# Patient Record
Sex: Female | Born: 1950
Health system: Southern US, Community
[De-identification: ages and names within clinical notes are randomized; demographics above are authoritative.]

## PROBLEM LIST (undated history)

## (undated) DIAGNOSIS — I10 Essential (primary) hypertension: Secondary | ICD-10-CM

## (undated) HISTORY — PX: BREAST CYST ASPIRATION: SHX578

---

## 2004-10-12 ENCOUNTER — Inpatient Hospital Stay: Payer: Self-pay | Admitting: Internal Medicine

## 2004-10-12 ENCOUNTER — Other Ambulatory Visit: Payer: Self-pay

## 2006-02-24 ENCOUNTER — Ambulatory Visit: Payer: Self-pay | Admitting: Urology

## 2006-04-21 ENCOUNTER — Ambulatory Visit: Payer: Self-pay | Admitting: Internal Medicine

## 2007-05-21 ENCOUNTER — Ambulatory Visit: Payer: Self-pay | Admitting: Internal Medicine

## 2015-11-02 ENCOUNTER — Other Ambulatory Visit: Payer: Self-pay | Admitting: Internal Medicine

## 2015-11-07 ENCOUNTER — Other Ambulatory Visit: Payer: Self-pay | Admitting: Internal Medicine

## 2015-11-07 ENCOUNTER — Other Ambulatory Visit: Payer: Self-pay | Admitting: *Deleted

## 2015-11-07 ENCOUNTER — Inpatient Hospital Stay
Admission: RE | Admit: 2015-11-07 | Discharge: 2015-11-07 | Disposition: A | Discharge status: Home / Self Care | Payer: Self-pay | Source: Ambulatory Visit | Attending: *Deleted | Admitting: *Deleted

## 2015-11-07 DIAGNOSIS — Z1231 Encounter for screening mammogram for malignant neoplasm of breast: Secondary | ICD-10-CM

## 2015-11-07 DIAGNOSIS — Z9289 Personal history of other medical treatment: Secondary | ICD-10-CM

## 2015-11-20 ENCOUNTER — Other Ambulatory Visit: Payer: Self-pay | Admitting: Internal Medicine

## 2015-11-20 ENCOUNTER — Ambulatory Visit
Admission: RE | Admit: 2015-11-20 | Discharge: 2015-11-20 | Disposition: A | Discharge status: Home / Self Care | Payer: BLUE CROSS/BLUE SHIELD | Source: Ambulatory Visit | Attending: Internal Medicine | Admitting: Internal Medicine

## 2015-11-20 DIAGNOSIS — Z1231 Encounter for screening mammogram for malignant neoplasm of breast: Secondary | ICD-10-CM | POA: Diagnosis present

## 2016-03-13 ENCOUNTER — Emergency Department: Payer: No Typology Code available for payment source

## 2016-03-13 ENCOUNTER — Encounter: Payer: Self-pay | Admitting: Emergency Medicine

## 2016-03-13 ENCOUNTER — Emergency Department
Admission: EM | Admit: 2016-03-13 | Discharge: 2016-03-13 | Disposition: A | Discharge status: Home / Self Care | Payer: No Typology Code available for payment source | Attending: Emergency Medicine | Admitting: Emergency Medicine

## 2016-03-13 DIAGNOSIS — I10 Essential (primary) hypertension: Secondary | ICD-10-CM | POA: Insufficient documentation

## 2016-03-13 DIAGNOSIS — Y9389 Activity, other specified: Secondary | ICD-10-CM | POA: Diagnosis not present

## 2016-03-13 DIAGNOSIS — Y92411 Interstate highway as the place of occurrence of the external cause: Secondary | ICD-10-CM | POA: Insufficient documentation

## 2016-03-13 DIAGNOSIS — Y999 Unspecified external cause status: Secondary | ICD-10-CM | POA: Insufficient documentation

## 2016-03-13 DIAGNOSIS — S29019A Strain of muscle and tendon of unspecified wall of thorax, initial encounter: Secondary | ICD-10-CM

## 2016-03-13 DIAGNOSIS — S299XXA Unspecified injury of thorax, initial encounter: Secondary | ICD-10-CM | POA: Diagnosis present

## 2016-03-13 DIAGNOSIS — S29012A Strain of muscle and tendon of back wall of thorax, initial encounter: Secondary | ICD-10-CM | POA: Diagnosis not present

## 2016-03-13 HISTORY — DX: Essential (primary) hypertension: I10

## 2016-03-13 MED ORDER — NAPROXEN 500 MG PO TABS: 500.0000 mg | ORAL_TABLET | Freq: Two times a day (BID) | ORAL | 0 refills | Status: AC

## 2016-03-13 MED ORDER — CYCLOBENZAPRINE HCL 10 MG PO TABS: 10.0000 mg | ORAL_TABLET | Freq: Three times a day (TID) | ORAL | 0 refills | Status: AC | PRN
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
## 2016-03-13 NOTE — Discharge Instructions (Signed)
Follow up with your PCP for symptoms that are not improving over the week. Expect to be more sore tomorrow and in different places than you are today. Return to the ER for symptoms that change or worsen if you are unable to schedule an appointment.

## 2016-03-21 DIAGNOSIS — R739 Hyperglycemia, unspecified: Secondary | ICD-10-CM | POA: Insufficient documentation

## 2016-09-09 ENCOUNTER — Other Ambulatory Visit: Payer: Self-pay | Admitting: Internal Medicine

## 2016-09-09 DIAGNOSIS — Z1231 Encounter for screening mammogram for malignant neoplasm of breast: Secondary | ICD-10-CM

## 2016-11-20 ENCOUNTER — Ambulatory Visit
Admission: RE | Admit: 2016-11-20 | Discharge: 2016-11-20 | Disposition: A | Discharge status: Home / Self Care | Payer: BLUE CROSS/BLUE SHIELD | Source: Ambulatory Visit | Attending: Internal Medicine | Admitting: Internal Medicine

## 2016-11-20 DIAGNOSIS — Z1231 Encounter for screening mammogram for malignant neoplasm of breast: Secondary | ICD-10-CM | POA: Insufficient documentation

## 2018-01-13 ENCOUNTER — Other Ambulatory Visit: Payer: Self-pay | Admitting: Internal Medicine

## 2018-01-13 DIAGNOSIS — Z1231 Encounter for screening mammogram for malignant neoplasm of breast: Secondary | ICD-10-CM

## 2018-03-16 ENCOUNTER — Ambulatory Visit
Admission: RE | Admit: 2018-03-16 | Discharge: 2018-03-16 | Disposition: A | Discharge status: Home / Self Care | Payer: Medicare HMO | Source: Ambulatory Visit | Attending: Internal Medicine | Admitting: Internal Medicine

## 2018-03-16 DIAGNOSIS — Z1231 Encounter for screening mammogram for malignant neoplasm of breast: Secondary | ICD-10-CM | POA: Diagnosis not present

## 2018-03-17 ENCOUNTER — Other Ambulatory Visit: Payer: Self-pay | Admitting: Internal Medicine

## 2018-03-17 DIAGNOSIS — N6320 Unspecified lump in the left breast, unspecified quadrant: Secondary | ICD-10-CM

## 2018-03-17 DIAGNOSIS — R928 Other abnormal and inconclusive findings on diagnostic imaging of breast: Secondary | ICD-10-CM

## 2018-04-02 ENCOUNTER — Ambulatory Visit
Admission: RE | Admit: 2018-04-02 | Discharge: 2018-04-02 | Disposition: A | Discharge status: Home / Self Care | Payer: Medicare HMO | Source: Ambulatory Visit | Attending: Internal Medicine | Admitting: Internal Medicine

## 2018-04-02 DIAGNOSIS — N6320 Unspecified lump in the left breast, unspecified quadrant: Secondary | ICD-10-CM

## 2018-04-02 DIAGNOSIS — R928 Other abnormal and inconclusive findings on diagnostic imaging of breast: Secondary | ICD-10-CM | POA: Diagnosis present

## 2019-03-16 ENCOUNTER — Other Ambulatory Visit: Payer: Self-pay | Admitting: Internal Medicine

## 2019-03-16 DIAGNOSIS — Z1231 Encounter for screening mammogram for malignant neoplasm of breast: Secondary | ICD-10-CM

## 2019-04-20 ENCOUNTER — Ambulatory Visit
Admission: RE | Admit: 2019-04-20 | Discharge: 2019-04-20 | Disposition: A | Discharge status: Home / Self Care | Payer: Medicare HMO | Source: Ambulatory Visit | Attending: Internal Medicine | Admitting: Internal Medicine

## 2019-04-20 DIAGNOSIS — Z1231 Encounter for screening mammogram for malignant neoplasm of breast: Secondary | ICD-10-CM | POA: Insufficient documentation

## 2019-12-19 DIAGNOSIS — M25511 Pain in right shoulder: Secondary | ICD-10-CM | POA: Insufficient documentation

## 2019-12-19 DIAGNOSIS — K5790 Diverticulosis of intestine, part unspecified, without perforation or abscess without bleeding: Secondary | ICD-10-CM | POA: Insufficient documentation

## 2019-12-19 DIAGNOSIS — R1031 Right lower quadrant pain: Secondary | ICD-10-CM | POA: Insufficient documentation

## 2019-12-19 DIAGNOSIS — I10 Essential (primary) hypertension: Secondary | ICD-10-CM | POA: Insufficient documentation

## 2019-12-19 DIAGNOSIS — E559 Vitamin D deficiency, unspecified: Secondary | ICD-10-CM | POA: Insufficient documentation

## 2019-12-19 DIAGNOSIS — Z87448 Personal history of other diseases of urinary system: Secondary | ICD-10-CM | POA: Insufficient documentation

## 2019-12-19 DIAGNOSIS — J302 Other seasonal allergic rhinitis: Secondary | ICD-10-CM | POA: Insufficient documentation

## 2019-12-19 DIAGNOSIS — H43819 Vitreous degeneration, unspecified eye: Secondary | ICD-10-CM | POA: Insufficient documentation

## 2020-03-26 ENCOUNTER — Other Ambulatory Visit: Payer: Self-pay | Admitting: Internal Medicine

## 2020-03-26 DIAGNOSIS — Z1231 Encounter for screening mammogram for malignant neoplasm of breast: Secondary | ICD-10-CM

## 2020-04-25 ENCOUNTER — Ambulatory Visit
Admission: RE | Admit: 2020-04-25 | Discharge: 2020-04-25 | Disposition: A | Discharge status: Home / Self Care | Payer: Medicare HMO | Source: Ambulatory Visit | Attending: Internal Medicine | Admitting: Internal Medicine

## 2020-04-25 ENCOUNTER — Other Ambulatory Visit: Payer: Self-pay

## 2020-04-25 DIAGNOSIS — Z1231 Encounter for screening mammogram for malignant neoplasm of breast: Secondary | ICD-10-CM | POA: Insufficient documentation

## 2020-05-01 ENCOUNTER — Other Ambulatory Visit: Payer: Self-pay | Admitting: Internal Medicine

## 2020-05-01 DIAGNOSIS — N6489 Other specified disorders of breast: Secondary | ICD-10-CM

## 2020-05-01 DIAGNOSIS — R928 Other abnormal and inconclusive findings on diagnostic imaging of breast: Secondary | ICD-10-CM

## 2020-05-02 DIAGNOSIS — R928 Other abnormal and inconclusive findings on diagnostic imaging of breast: Secondary | ICD-10-CM | POA: Diagnosis not present

## 2020-05-02 DIAGNOSIS — N644 Mastodynia: Secondary | ICD-10-CM | POA: Diagnosis not present

## 2020-05-04 ENCOUNTER — Ambulatory Visit
Admission: RE | Admit: 2020-05-04 | Discharge: 2020-05-04 | Disposition: A | Discharge status: Home / Self Care | Payer: Medicare HMO | Source: Ambulatory Visit | Attending: Internal Medicine | Admitting: Internal Medicine

## 2020-05-04 ENCOUNTER — Other Ambulatory Visit: Payer: Self-pay

## 2020-05-04 ENCOUNTER — Other Ambulatory Visit: Payer: Self-pay | Admitting: Internal Medicine

## 2020-05-04 DIAGNOSIS — R922 Inconclusive mammogram: Secondary | ICD-10-CM | POA: Diagnosis not present

## 2020-05-04 DIAGNOSIS — N6489 Other specified disorders of breast: Secondary | ICD-10-CM | POA: Diagnosis not present

## 2020-05-04 DIAGNOSIS — R928 Other abnormal and inconclusive findings on diagnostic imaging of breast: Secondary | ICD-10-CM | POA: Insufficient documentation

## 2020-05-04 DIAGNOSIS — N644 Mastodynia: Secondary | ICD-10-CM | POA: Diagnosis not present

## 2020-09-27 DIAGNOSIS — H43813 Vitreous degeneration, bilateral: Secondary | ICD-10-CM | POA: Diagnosis not present

## 2020-09-27 DIAGNOSIS — H26493 Other secondary cataract, bilateral: Secondary | ICD-10-CM | POA: Diagnosis not present

## 2020-11-08 DIAGNOSIS — R1011 Right upper quadrant pain: Secondary | ICD-10-CM | POA: Diagnosis not present

## 2020-11-08 DIAGNOSIS — R143 Flatulence: Secondary | ICD-10-CM | POA: Diagnosis not present

## 2020-11-08 DIAGNOSIS — R140 Abdominal distension (gaseous): Secondary | ICD-10-CM | POA: Diagnosis not present

## 2020-12-14 DIAGNOSIS — H26493 Other secondary cataract, bilateral: Secondary | ICD-10-CM | POA: Diagnosis not present

## 2020-12-14 DIAGNOSIS — H26492 Other secondary cataract, left eye: Secondary | ICD-10-CM | POA: Diagnosis not present

## 2020-12-14 DIAGNOSIS — H40013 Open angle with borderline findings, low risk, bilateral: Secondary | ICD-10-CM | POA: Diagnosis not present

## 2020-12-14 DIAGNOSIS — Z961 Presence of intraocular lens: Secondary | ICD-10-CM | POA: Diagnosis not present

## 2021-01-10 DIAGNOSIS — R1011 Right upper quadrant pain: Secondary | ICD-10-CM | POA: Diagnosis not present

## 2021-02-18 DIAGNOSIS — H26491 Other secondary cataract, right eye: Secondary | ICD-10-CM | POA: Diagnosis not present

## 2021-03-27 ENCOUNTER — Other Ambulatory Visit: Payer: Self-pay | Admitting: Internal Medicine

## 2021-03-27 DIAGNOSIS — Z1231 Encounter for screening mammogram for malignant neoplasm of breast: Secondary | ICD-10-CM

## 2021-03-29 DIAGNOSIS — Z87448 Personal history of other diseases of urinary system: Secondary | ICD-10-CM | POA: Diagnosis not present

## 2021-03-29 DIAGNOSIS — E785 Hyperlipidemia, unspecified: Secondary | ICD-10-CM | POA: Diagnosis not present

## 2021-03-29 DIAGNOSIS — E559 Vitamin D deficiency, unspecified: Secondary | ICD-10-CM | POA: Diagnosis not present

## 2021-03-29 DIAGNOSIS — R739 Hyperglycemia, unspecified: Secondary | ICD-10-CM | POA: Diagnosis not present

## 2021-04-03 DIAGNOSIS — Z1389 Encounter for screening for other disorder: Secondary | ICD-10-CM | POA: Diagnosis not present

## 2021-04-03 DIAGNOSIS — Z0000 Encounter for general adult medical examination without abnormal findings: Secondary | ICD-10-CM | POA: Diagnosis not present

## 2021-04-03 DIAGNOSIS — I10 Essential (primary) hypertension: Secondary | ICD-10-CM | POA: Diagnosis not present

## 2021-04-03 DIAGNOSIS — Z87448 Personal history of other diseases of urinary system: Secondary | ICD-10-CM | POA: Diagnosis not present

## 2021-04-03 DIAGNOSIS — E559 Vitamin D deficiency, unspecified: Secondary | ICD-10-CM | POA: Diagnosis not present

## 2021-04-03 DIAGNOSIS — Z780 Asymptomatic menopausal state: Secondary | ICD-10-CM | POA: Diagnosis not present

## 2021-04-03 DIAGNOSIS — R739 Hyperglycemia, unspecified: Secondary | ICD-10-CM | POA: Diagnosis not present

## 2021-04-03 DIAGNOSIS — E785 Hyperlipidemia, unspecified: Secondary | ICD-10-CM | POA: Diagnosis not present

## 2021-05-20 DIAGNOSIS — H43813 Vitreous degeneration, bilateral: Secondary | ICD-10-CM | POA: Diagnosis not present

## 2021-05-20 DIAGNOSIS — H04123 Dry eye syndrome of bilateral lacrimal glands: Secondary | ICD-10-CM | POA: Diagnosis not present

## 2021-05-20 DIAGNOSIS — H26493 Other secondary cataract, bilateral: Secondary | ICD-10-CM | POA: Diagnosis not present

## 2021-06-03 DIAGNOSIS — M810 Age-related osteoporosis without current pathological fracture: Secondary | ICD-10-CM | POA: Diagnosis not present

## 2021-06-04 ENCOUNTER — Other Ambulatory Visit: Payer: Self-pay

## 2021-06-04 ENCOUNTER — Ambulatory Visit
Admission: RE | Admit: 2021-06-04 | Discharge: 2021-06-04 | Disposition: A | Discharge status: Home / Self Care | Payer: Medicare HMO | Source: Ambulatory Visit | Attending: Internal Medicine | Admitting: Internal Medicine

## 2021-06-04 DIAGNOSIS — Z1231 Encounter for screening mammogram for malignant neoplasm of breast: Secondary | ICD-10-CM | POA: Diagnosis not present

## 2021-10-25 DIAGNOSIS — M79675 Pain in left toe(s): Secondary | ICD-10-CM | POA: Diagnosis not present

## 2021-10-25 DIAGNOSIS — M79674 Pain in right toe(s): Secondary | ICD-10-CM | POA: Diagnosis not present

## 2021-10-25 DIAGNOSIS — L600 Ingrowing nail: Secondary | ICD-10-CM | POA: Diagnosis not present

## 2021-10-25 DIAGNOSIS — B351 Tinea unguium: Secondary | ICD-10-CM | POA: Diagnosis not present

## 2021-12-17 DIAGNOSIS — D2261 Melanocytic nevi of right upper limb, including shoulder: Secondary | ICD-10-CM | POA: Diagnosis not present

## 2021-12-17 DIAGNOSIS — D2271 Melanocytic nevi of right lower limb, including hip: Secondary | ICD-10-CM | POA: Diagnosis not present

## 2021-12-17 DIAGNOSIS — L918 Other hypertrophic disorders of the skin: Secondary | ICD-10-CM | POA: Diagnosis not present

## 2021-12-17 DIAGNOSIS — D2272 Melanocytic nevi of left lower limb, including hip: Secondary | ICD-10-CM | POA: Diagnosis not present

## 2021-12-17 DIAGNOSIS — L538 Other specified erythematous conditions: Secondary | ICD-10-CM | POA: Diagnosis not present

## 2021-12-17 DIAGNOSIS — L821 Other seborrheic keratosis: Secondary | ICD-10-CM | POA: Diagnosis not present

## 2021-12-17 DIAGNOSIS — L720 Epidermal cyst: Secondary | ICD-10-CM | POA: Diagnosis not present

## 2021-12-17 DIAGNOSIS — D225 Melanocytic nevi of trunk: Secondary | ICD-10-CM | POA: Diagnosis not present

## 2021-12-17 DIAGNOSIS — D2262 Melanocytic nevi of left upper limb, including shoulder: Secondary | ICD-10-CM | POA: Diagnosis not present

## 2022-02-28 DIAGNOSIS — H43813 Vitreous degeneration, bilateral: Secondary | ICD-10-CM | POA: Diagnosis not present

## 2022-02-28 DIAGNOSIS — H20013 Primary iridocyclitis, bilateral: Secondary | ICD-10-CM | POA: Diagnosis not present

## 2022-02-28 DIAGNOSIS — H04123 Dry eye syndrome of bilateral lacrimal glands: Secondary | ICD-10-CM | POA: Diagnosis not present

## 2022-04-01 DIAGNOSIS — H02881 Meibomian gland dysfunction right upper eyelid: Secondary | ICD-10-CM | POA: Diagnosis not present

## 2022-04-01 DIAGNOSIS — H02884 Meibomian gland dysfunction left upper eyelid: Secondary | ICD-10-CM | POA: Diagnosis not present

## 2022-04-28 ENCOUNTER — Other Ambulatory Visit: Payer: Self-pay | Admitting: Internal Medicine

## 2022-04-28 DIAGNOSIS — Z1231 Encounter for screening mammogram for malignant neoplasm of breast: Secondary | ICD-10-CM

## 2022-04-29 DIAGNOSIS — H02884 Meibomian gland dysfunction left upper eyelid: Secondary | ICD-10-CM | POA: Diagnosis not present

## 2022-04-29 DIAGNOSIS — H02881 Meibomian gland dysfunction right upper eyelid: Secondary | ICD-10-CM | POA: Diagnosis not present

## 2022-05-14 DIAGNOSIS — Z86010 Personal history of colonic polyps: Secondary | ICD-10-CM | POA: Diagnosis not present

## 2022-05-14 DIAGNOSIS — R140 Abdominal distension (gaseous): Secondary | ICD-10-CM | POA: Diagnosis not present

## 2022-05-14 DIAGNOSIS — R194 Change in bowel habit: Secondary | ICD-10-CM | POA: Diagnosis not present

## 2022-05-14 DIAGNOSIS — R1011 Right upper quadrant pain: Secondary | ICD-10-CM | POA: Diagnosis not present

## 2022-05-15 DIAGNOSIS — Z1331 Encounter for screening for depression: Secondary | ICD-10-CM | POA: Diagnosis not present

## 2022-05-15 DIAGNOSIS — Z87448 Personal history of other diseases of urinary system: Secondary | ICD-10-CM | POA: Diagnosis not present

## 2022-05-15 DIAGNOSIS — E785 Hyperlipidemia, unspecified: Secondary | ICD-10-CM | POA: Diagnosis not present

## 2022-05-15 DIAGNOSIS — I10 Essential (primary) hypertension: Secondary | ICD-10-CM | POA: Diagnosis not present

## 2022-05-15 DIAGNOSIS — R7303 Prediabetes: Secondary | ICD-10-CM | POA: Diagnosis not present

## 2022-05-15 DIAGNOSIS — Z0000 Encounter for general adult medical examination without abnormal findings: Secondary | ICD-10-CM | POA: Diagnosis not present

## 2022-05-15 DIAGNOSIS — E559 Vitamin D deficiency, unspecified: Secondary | ICD-10-CM | POA: Diagnosis not present

## 2022-05-21 DIAGNOSIS — H43813 Vitreous degeneration, bilateral: Secondary | ICD-10-CM | POA: Diagnosis not present

## 2022-05-21 DIAGNOSIS — H26493 Other secondary cataract, bilateral: Secondary | ICD-10-CM | POA: Diagnosis not present

## 2022-05-21 DIAGNOSIS — H02889 Meibomian gland dysfunction of unspecified eye, unspecified eyelid: Secondary | ICD-10-CM | POA: Diagnosis not present

## 2022-05-21 DIAGNOSIS — Z0100 Encounter for examination of eyes and vision without abnormal findings: Secondary | ICD-10-CM | POA: Diagnosis not present

## 2022-06-06 ENCOUNTER — Ambulatory Visit
Admission: RE | Admit: 2022-06-06 | Discharge: 2022-06-06 | Disposition: A | Discharge status: Home / Self Care | Payer: Medicare HMO | Source: Ambulatory Visit | Attending: Internal Medicine | Admitting: Internal Medicine

## 2022-06-06 DIAGNOSIS — Z1231 Encounter for screening mammogram for malignant neoplasm of breast: Secondary | ICD-10-CM | POA: Diagnosis not present

## 2022-06-12 DIAGNOSIS — K760 Fatty (change of) liver, not elsewhere classified: Secondary | ICD-10-CM | POA: Diagnosis not present

## 2022-07-24 DIAGNOSIS — D123 Benign neoplasm of transverse colon: Secondary | ICD-10-CM | POA: Diagnosis not present

## 2022-07-24 DIAGNOSIS — Z1211 Encounter for screening for malignant neoplasm of colon: Secondary | ICD-10-CM | POA: Diagnosis not present

## 2022-07-24 DIAGNOSIS — K6389 Other specified diseases of intestine: Secondary | ICD-10-CM | POA: Diagnosis not present

## 2022-07-24 DIAGNOSIS — D125 Benign neoplasm of sigmoid colon: Secondary | ICD-10-CM | POA: Diagnosis not present

## 2022-07-24 DIAGNOSIS — K635 Polyp of colon: Secondary | ICD-10-CM | POA: Diagnosis not present

## 2022-07-24 DIAGNOSIS — R140 Abdominal distension (gaseous): Secondary | ICD-10-CM | POA: Diagnosis not present

## 2022-07-24 DIAGNOSIS — K5730 Diverticulosis of large intestine without perforation or abscess without bleeding: Secondary | ICD-10-CM | POA: Diagnosis not present

## 2022-07-24 DIAGNOSIS — R1011 Right upper quadrant pain: Secondary | ICD-10-CM | POA: Diagnosis not present

## 2022-07-24 DIAGNOSIS — I10 Essential (primary) hypertension: Secondary | ICD-10-CM | POA: Diagnosis not present

## 2022-07-24 DIAGNOSIS — Z86010 Personal history of colonic polyps: Secondary | ICD-10-CM | POA: Diagnosis not present

## 2022-08-11 DIAGNOSIS — H02884 Meibomian gland dysfunction left upper eyelid: Secondary | ICD-10-CM | POA: Diagnosis not present

## 2022-08-11 DIAGNOSIS — H02881 Meibomian gland dysfunction right upper eyelid: Secondary | ICD-10-CM | POA: Diagnosis not present

## 2022-11-07 ENCOUNTER — Ambulatory Visit: Payer: Medicare HMO | Admitting: Podiatry

## 2022-11-07 ENCOUNTER — Encounter: Payer: Self-pay | Admitting: Podiatry

## 2022-11-07 DIAGNOSIS — L600 Ingrowing nail: Secondary | ICD-10-CM | POA: Diagnosis not present

## 2022-11-07 NOTE — Patient Instructions (Signed)

## 2022-11-09 NOTE — Progress Notes (Signed)
Subjective:   Patient ID: Carolyn Collins, female   DOB: 72 y.o.   MRN: [id number]   HPI Patient presents with damaged nailbeds left hallux and second toes that get ingrown and become painful and make it hard to wear shoe gear.  Has tried to trim soak without relief of symptoms has been present for a long period of time does not smoke likes to be active   Review of Systems  All other systems reviewed and are negative.       Objective:  Physical Exam Vitals and nursing note reviewed.  Constitutional:      Appearance: She is well-developed.  Pulmonary:     Effort: Pulmonary effort is normal.  Musculoskeletal:        General: Normal range of motion.  Skin:    General: Skin is warm.  Neurological:     Mental Status: She is alert.     Neurovascular status intact muscle strength adequate range of motion with patient noted to have significant damage to the left hallux and second nailbed incurvated in the corners across the bed painful when pressed from a dorsal direction.  Good digital perfusion well-oriented x 3     Assessment:  Significant nail disease left big toe second toe nailbeds that are painful chronic with patient having inability to take care of these herself     Plan:  Chronic damage discussed H&P done reviewed treatment options patient opted for removal and I allowed patient to read consent form for removal of the hallux and second nail left foot.  Patient wants surgery and I reviewed what would be done patient is willing to accept risk and at this point I anesthetized the 2 toes 120 mg Xylocaine Marcaine mixture sterile prep done using sterile instrumentation remove the hallux second nails exposed matrix applied phenol 5 applications to the big toenail 3 applications second nail applied sterile dressing instructed on soaks reappoint to recheck

## 2023-01-03 IMAGING — MG MM DIGITAL SCREENING BILAT W/ TOMO AND CAD
8 series · 8 of 24 positions shown · non-contrast
Comparison: Previous exam(s).

CLINICAL DATA: Screening.

EXAM:
DIGITAL SCREENING BILATERAL MAMMOGRAM WITH TOMOSYNTHESIS AND CAD
TECHNIQUE: Bilateral screening digital craniocaudal and mediolateral oblique
mammograms were obtained. Bilateral screening digital breast
tomosynthesis was performed. The images were evaluated with
computer-aided detection.

[L CC synth-2D]
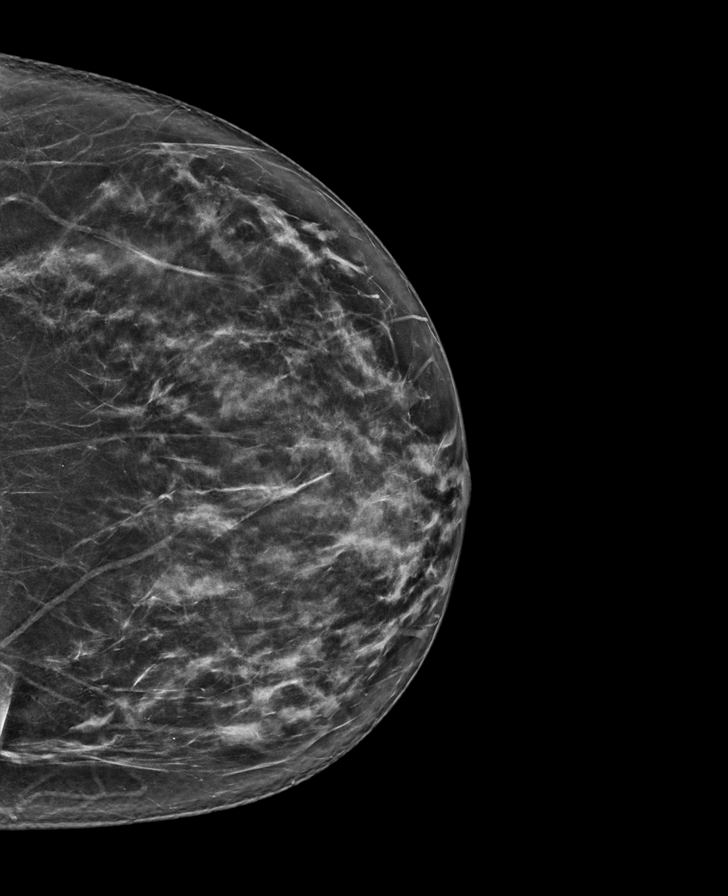

[R CC synth-2D]
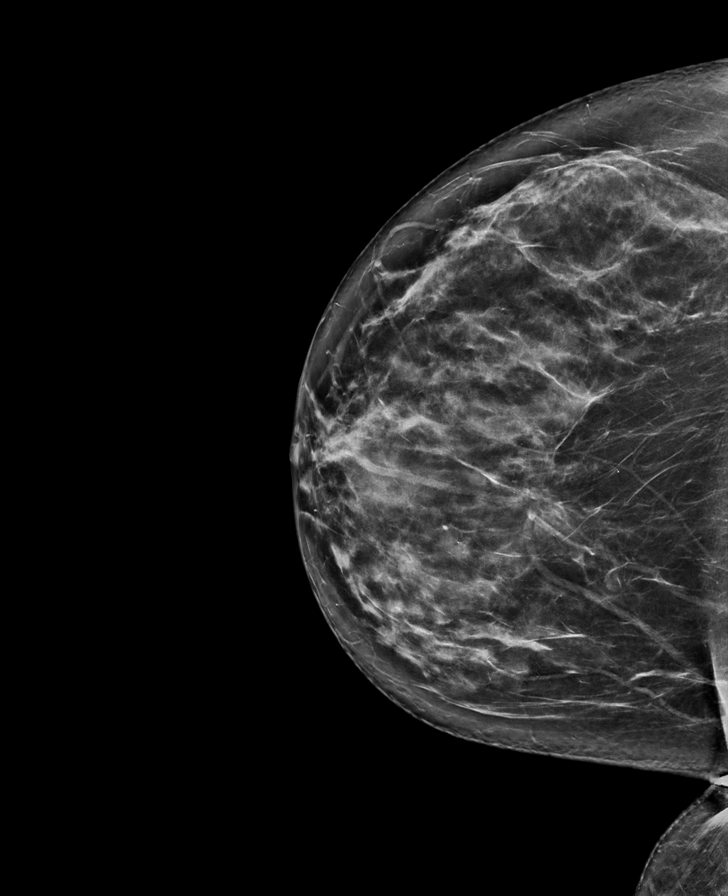

[R MLO synth-2D]
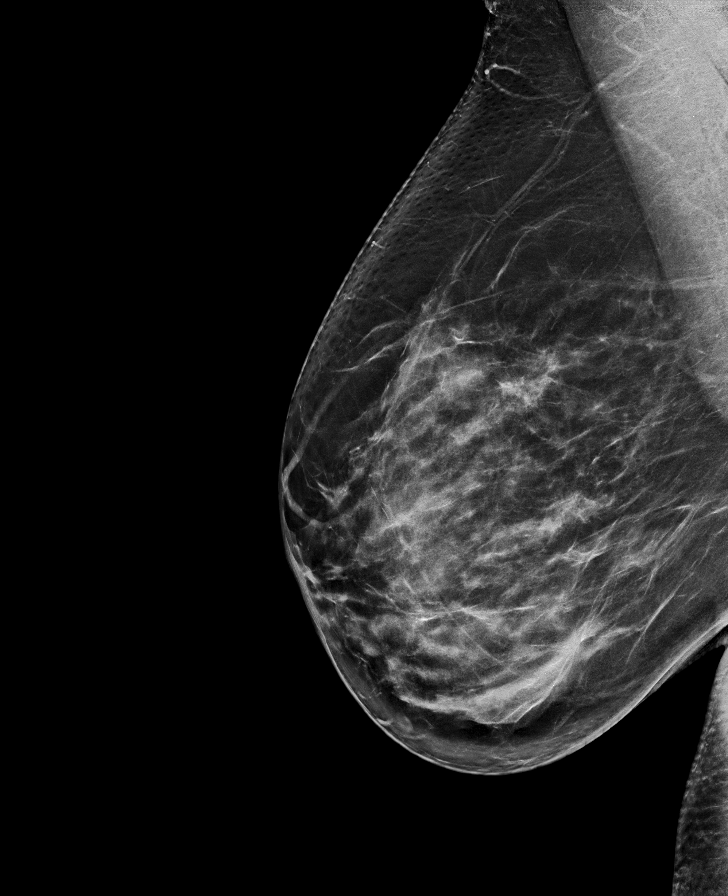

[L MLO synth-2D]
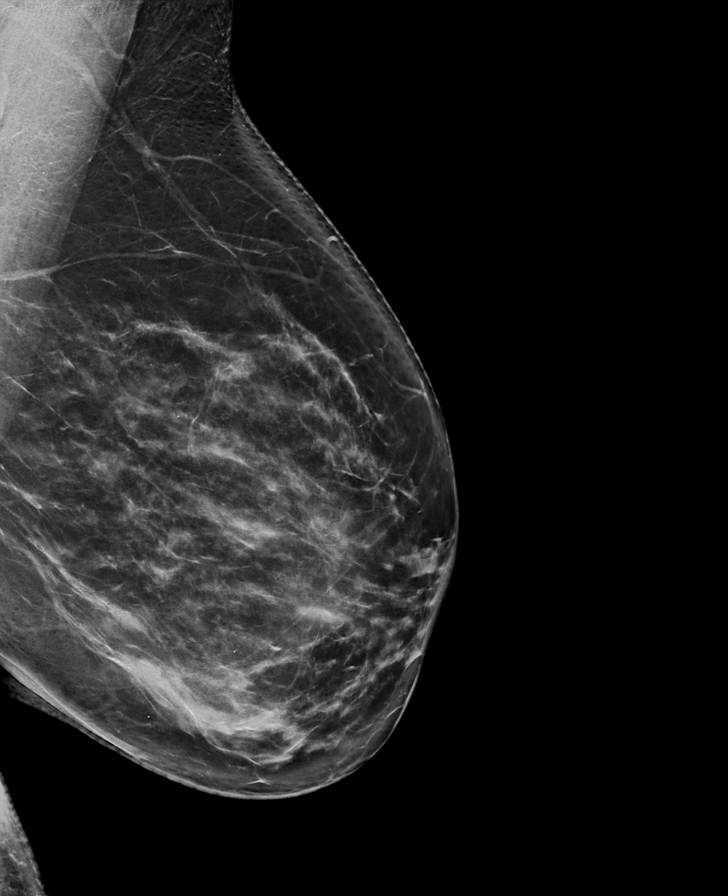

[L CC tomo · tomo slice 39/77.0]
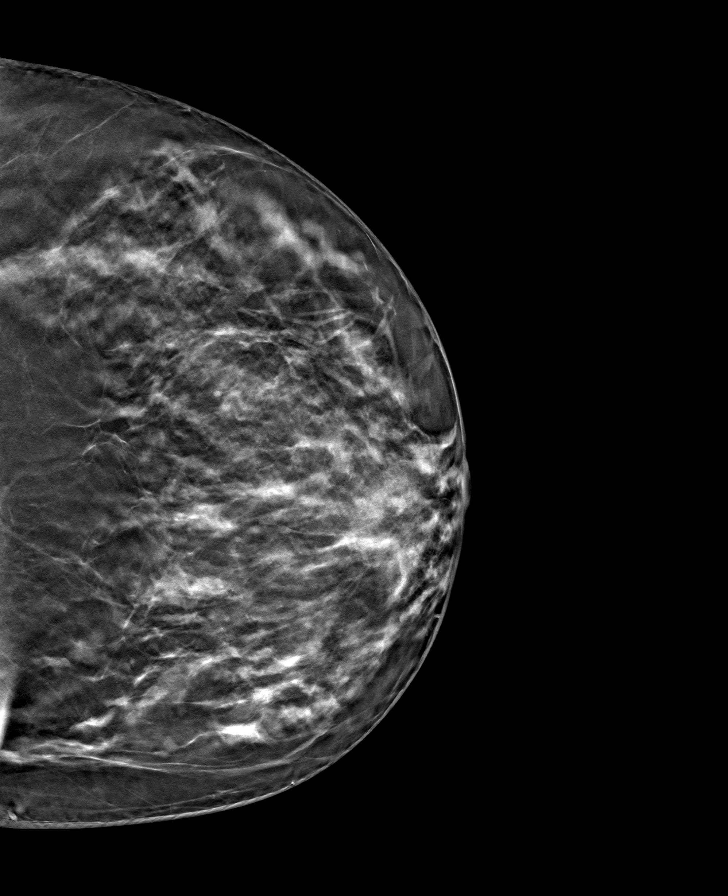

[R MLO tomo · tomo slice 44/87.0]
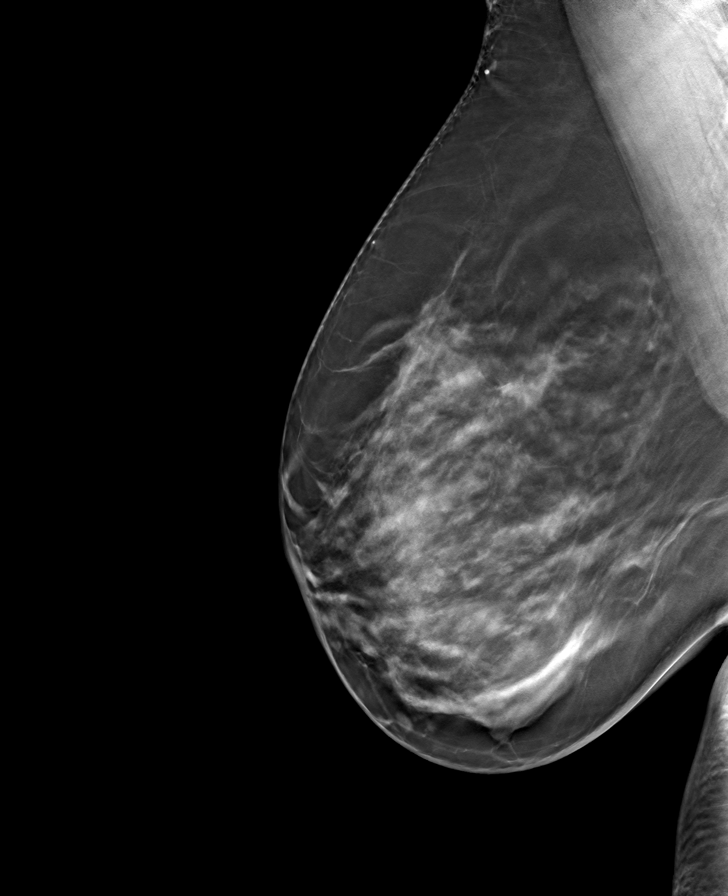

[R CC tomo · tomo slice 43/84.0]
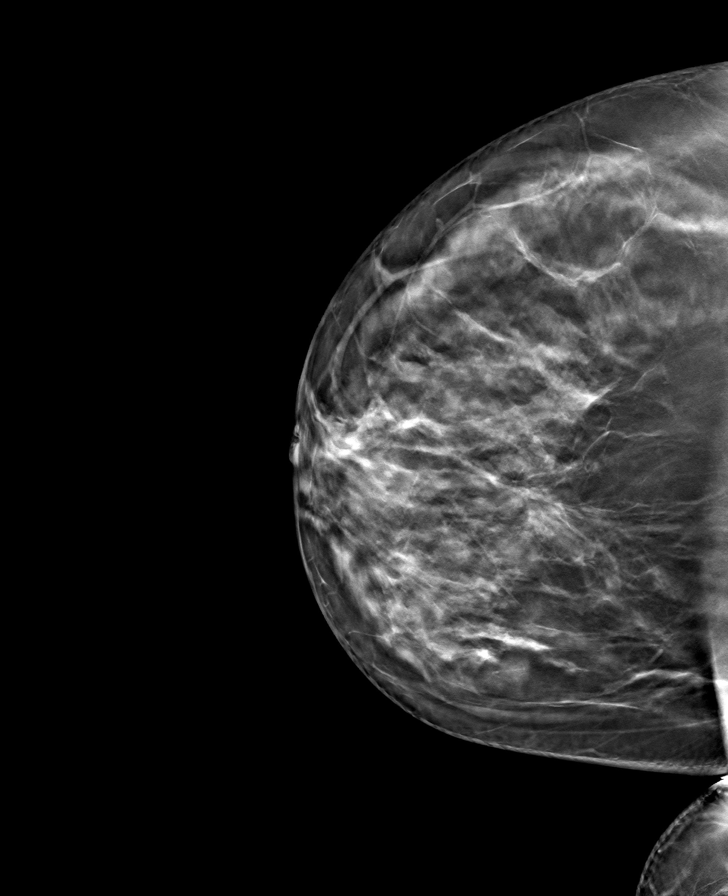

[L MLO tomo · tomo slice 49/98.0]
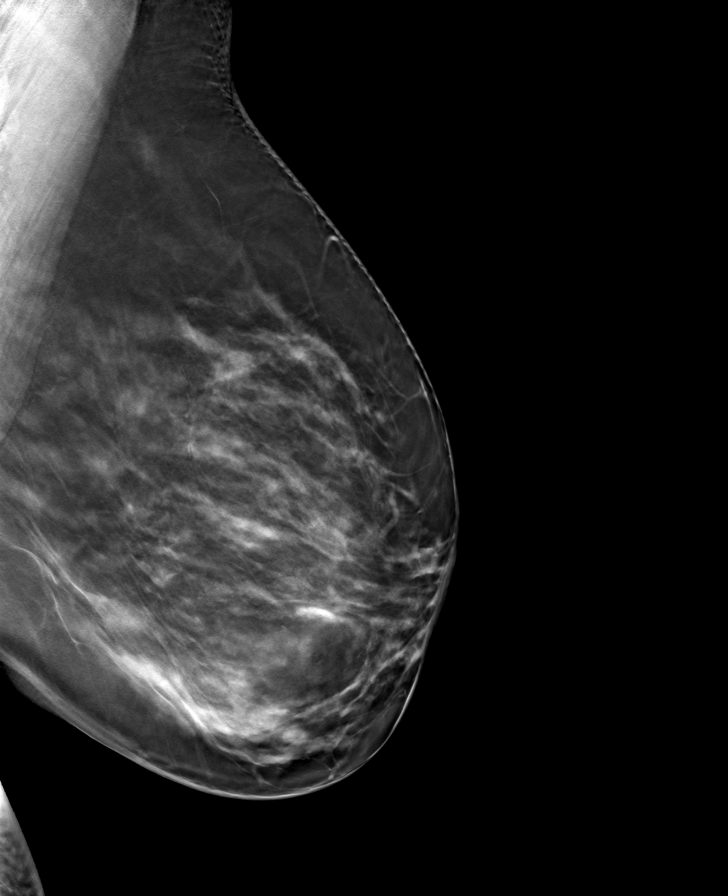

[8 of 24 positions shown; findings below may reference images not displayed]

ACR Breast Density Category c: The breast tissue is heterogeneously
dense, which may obscure small masses.
FINDINGS: There are no findings suspicious for malignancy.
IMPRESSION: No mammographic evidence of malignancy. A result letter of this
screening mammogram will be mailed directly to the patient.

RECOMMENDATION:
Screening mammogram in one year. (Code:Q3-W-BC3)

BI-RADS CATEGORY  1: Negative.

## 2023-01-28 DIAGNOSIS — I10 Essential (primary) hypertension: Secondary | ICD-10-CM | POA: Diagnosis not present

## 2023-01-28 DIAGNOSIS — E785 Hyperlipidemia, unspecified: Secondary | ICD-10-CM | POA: Diagnosis not present

## 2023-01-28 DIAGNOSIS — Z87448 Personal history of other diseases of urinary system: Secondary | ICD-10-CM | POA: Diagnosis not present

## 2023-01-28 DIAGNOSIS — R7303 Prediabetes: Secondary | ICD-10-CM | POA: Diagnosis not present

## 2023-02-04 DIAGNOSIS — Z23 Encounter for immunization: Secondary | ICD-10-CM | POA: Diagnosis not present

## 2023-02-04 DIAGNOSIS — I10 Essential (primary) hypertension: Secondary | ICD-10-CM | POA: Diagnosis not present

## 2023-02-04 DIAGNOSIS — E0590 Thyrotoxicosis, unspecified without thyrotoxic crisis or storm: Secondary | ICD-10-CM | POA: Diagnosis not present

## 2023-02-04 DIAGNOSIS — R7303 Prediabetes: Secondary | ICD-10-CM | POA: Diagnosis not present

## 2023-02-04 DIAGNOSIS — E559 Vitamin D deficiency, unspecified: Secondary | ICD-10-CM | POA: Diagnosis not present

## 2023-02-04 DIAGNOSIS — E785 Hyperlipidemia, unspecified: Secondary | ICD-10-CM | POA: Diagnosis not present

## 2023-02-04 DIAGNOSIS — Z87448 Personal history of other diseases of urinary system: Secondary | ICD-10-CM | POA: Diagnosis not present

## 2023-02-23 DIAGNOSIS — E0590 Thyrotoxicosis, unspecified without thyrotoxic crisis or storm: Secondary | ICD-10-CM | POA: Diagnosis not present

## 2023-03-13 DIAGNOSIS — R7303 Prediabetes: Secondary | ICD-10-CM | POA: Diagnosis not present

## 2023-03-13 DIAGNOSIS — Z87448 Personal history of other diseases of urinary system: Secondary | ICD-10-CM | POA: Diagnosis not present

## 2023-03-13 DIAGNOSIS — I10 Essential (primary) hypertension: Secondary | ICD-10-CM | POA: Diagnosis not present

## 2023-05-11 ENCOUNTER — Other Ambulatory Visit: Payer: Self-pay | Admitting: Internal Medicine

## 2023-05-11 DIAGNOSIS — Z1231 Encounter for screening mammogram for malignant neoplasm of breast: Secondary | ICD-10-CM

## 2023-06-08 ENCOUNTER — Ambulatory Visit
Admission: RE | Admit: 2023-06-08 | Discharge: 2023-06-08 | Disposition: A | Discharge status: Home / Self Care | Payer: Medicare HMO | Source: Ambulatory Visit | Attending: Internal Medicine | Admitting: Internal Medicine

## 2023-06-08 DIAGNOSIS — Z1231 Encounter for screening mammogram for malignant neoplasm of breast: Secondary | ICD-10-CM | POA: Insufficient documentation
